# Patient Record
Sex: Female | Born: 1970 | Hispanic: Yes | Marital: Married | Smoking: Never smoker
Health system: Southern US, Community
[De-identification: ages and names within clinical notes are randomized; demographics above are authoritative.]

## PROBLEM LIST (undated history)

## (undated) DIAGNOSIS — I10 Essential (primary) hypertension: Secondary | ICD-10-CM

## (undated) HISTORY — PX: ABDOMINAL SURGERY: SHX537

## (undated) HISTORY — PX: OTHER SURGICAL HISTORY: SHX169

---

## 2003-02-03 ENCOUNTER — Other Ambulatory Visit: Admission: RE | Admit: 2003-02-03 | Discharge: 2003-02-03 | Payer: Self-pay | Admitting: Obstetrics and Gynecology

## 2003-05-28 ENCOUNTER — Other Ambulatory Visit: Admission: RE | Admit: 2003-05-28 | Discharge: 2003-05-28 | Payer: Self-pay | Admitting: Obstetrics and Gynecology

## 2003-12-23 ENCOUNTER — Other Ambulatory Visit: Admission: RE | Admit: 2003-12-23 | Discharge: 2003-12-23 | Payer: Self-pay | Admitting: Gynecology

## 2004-02-04 ENCOUNTER — Ambulatory Visit: Payer: Self-pay | Admitting: *Deleted

## 2004-02-04 ENCOUNTER — Other Ambulatory Visit: Admission: RE | Admit: 2004-02-04 | Discharge: 2004-02-04 | Payer: Self-pay | Admitting: *Deleted

## 2004-02-18 ENCOUNTER — Ambulatory Visit: Payer: Self-pay | Admitting: *Deleted

## 2004-06-07 ENCOUNTER — Other Ambulatory Visit: Admission: RE | Admit: 2004-06-07 | Discharge: 2004-06-07 | Payer: Self-pay | Admitting: Gynecology

## 2005-06-15 ENCOUNTER — Ambulatory Visit (HOSPITAL_COMMUNITY): Admission: RE | Admit: 2005-06-15 | Discharge: 2005-06-15 | Payer: Self-pay | Admitting: *Deleted

## 2005-08-08 ENCOUNTER — Ambulatory Visit (HOSPITAL_COMMUNITY): Admission: RE | Admit: 2005-08-08 | Discharge: 2005-08-08 | Payer: Self-pay | Admitting: Obstetrics & Gynecology

## 2005-08-20 ENCOUNTER — Ambulatory Visit: Payer: Self-pay | Admitting: Family Medicine

## 2005-08-27 ENCOUNTER — Ambulatory Visit (HOSPITAL_COMMUNITY): Admission: RE | Admit: 2005-08-27 | Discharge: 2005-08-27 | Payer: Self-pay | Admitting: Obstetrics & Gynecology

## 2005-08-27 ENCOUNTER — Ambulatory Visit: Payer: Self-pay | Admitting: Gynecology

## 2005-08-29 ENCOUNTER — Encounter: Admission: RE | Admit: 2005-08-29 | Discharge: 2005-08-29 | Payer: Self-pay | Admitting: Obstetrics & Gynecology

## 2005-09-06 ENCOUNTER — Ambulatory Visit: Payer: Self-pay | Admitting: *Deleted

## 2005-09-13 ENCOUNTER — Ambulatory Visit: Payer: Self-pay | Admitting: Gynecology

## 2005-09-15 ENCOUNTER — Ambulatory Visit: Payer: Self-pay | Admitting: Family Medicine

## 2005-09-15 ENCOUNTER — Inpatient Hospital Stay (HOSPITAL_COMMUNITY): Admission: AD | Admit: 2005-09-15 | Discharge: 2005-09-18 | Payer: Self-pay | Admitting: Family Medicine

## 2005-11-16 ENCOUNTER — Inpatient Hospital Stay (HOSPITAL_COMMUNITY): Admission: AD | Admit: 2005-11-16 | Discharge: 2005-11-16 | Payer: Self-pay | Admitting: Family Medicine

## 2006-04-14 IMAGING — US US OB DETAIL+14 WK
1 series · 13 of 28 positions shown · non-contrast
Comparison: none

CLINICAL DATA: Anatomy.  Advanced maternal age.

[Series 1: us ob detail+14 wk · 0.33mm/px · 119 acquisitions, 13 frames shown]
[im 5/119]
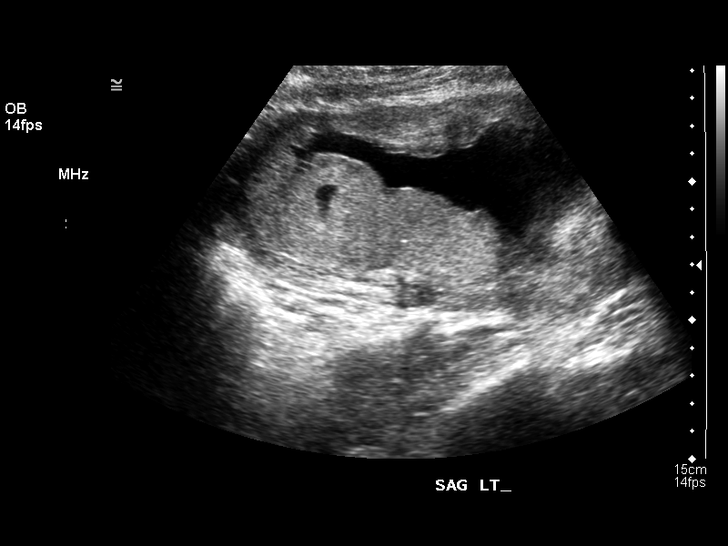
[im 14/119]
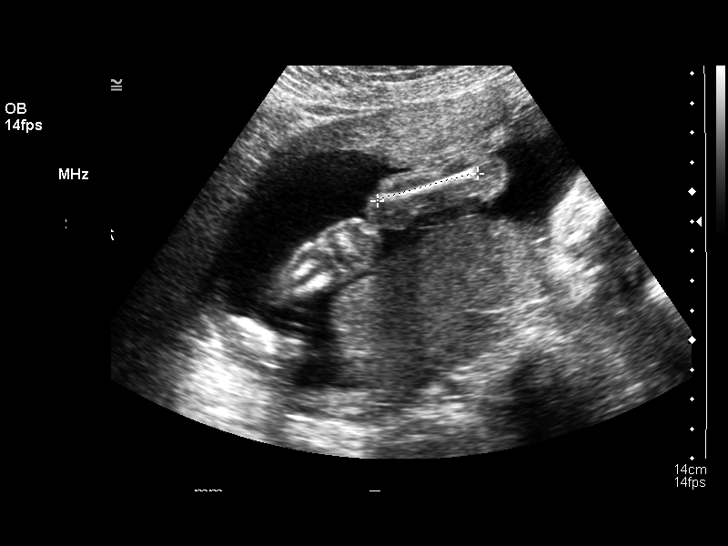
[im 22/119]
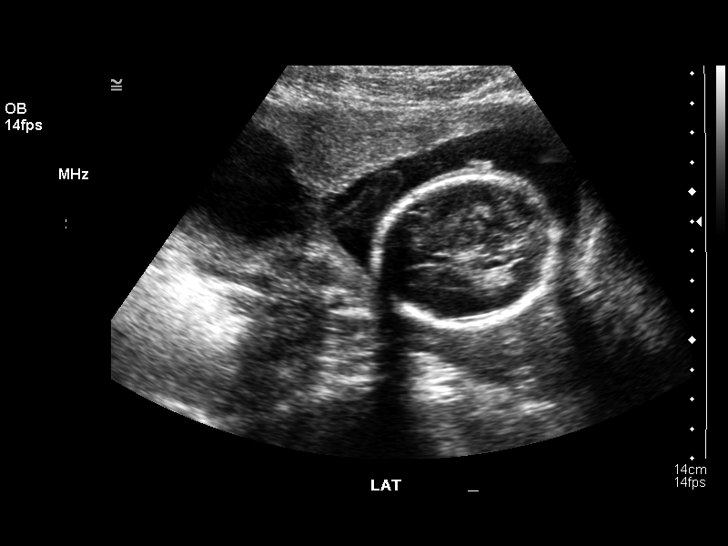
[im 31/119]
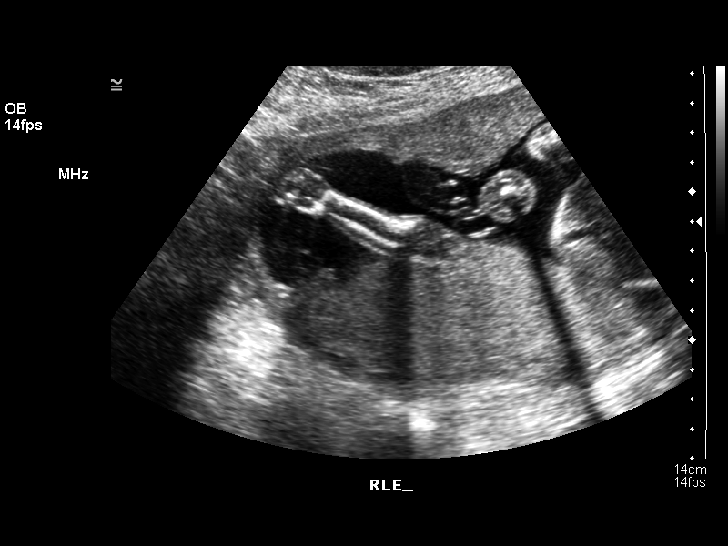
[im 40/119]
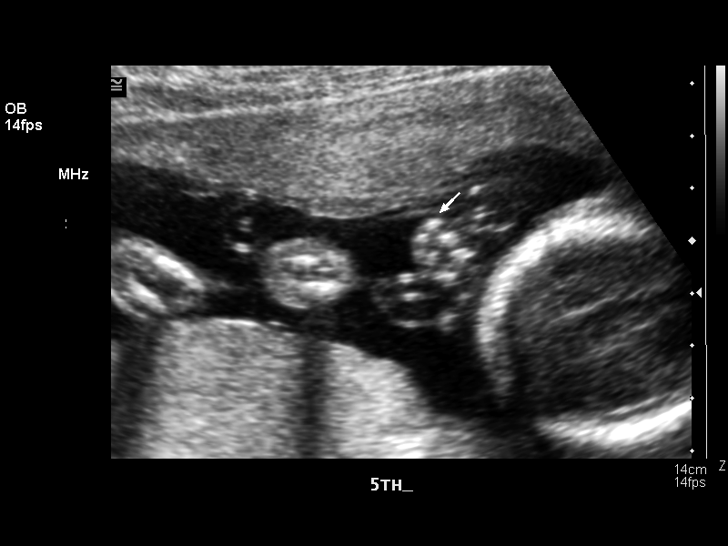
[im 49/119]
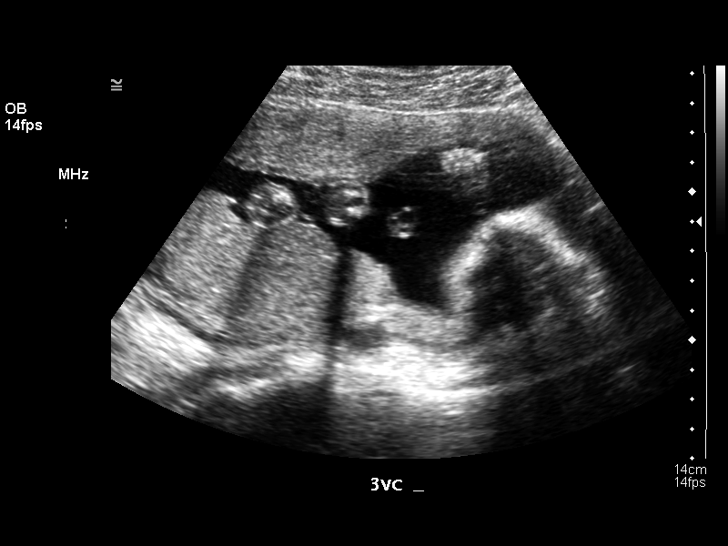
[im 62/119]
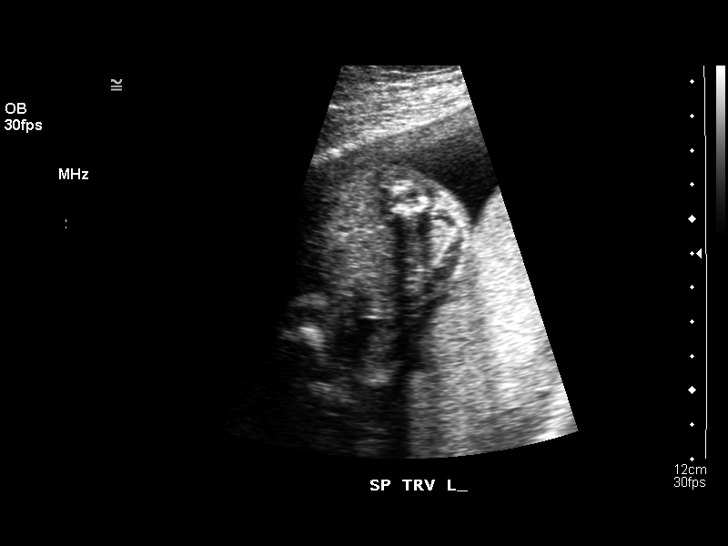
[im 70/119]
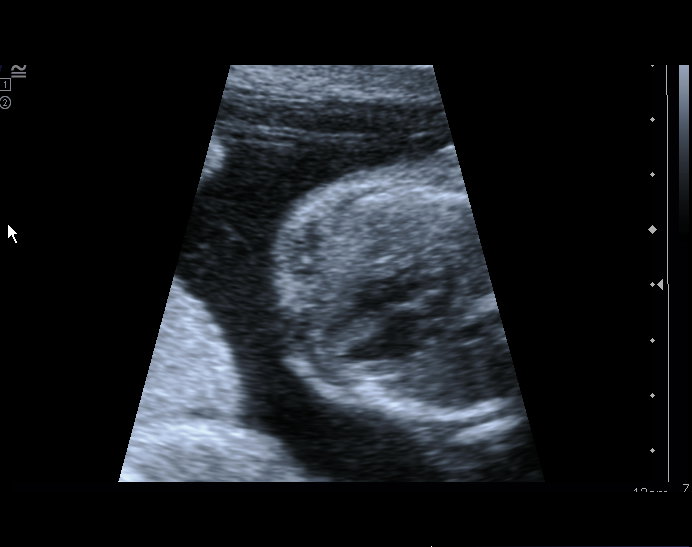
[im 79/119]
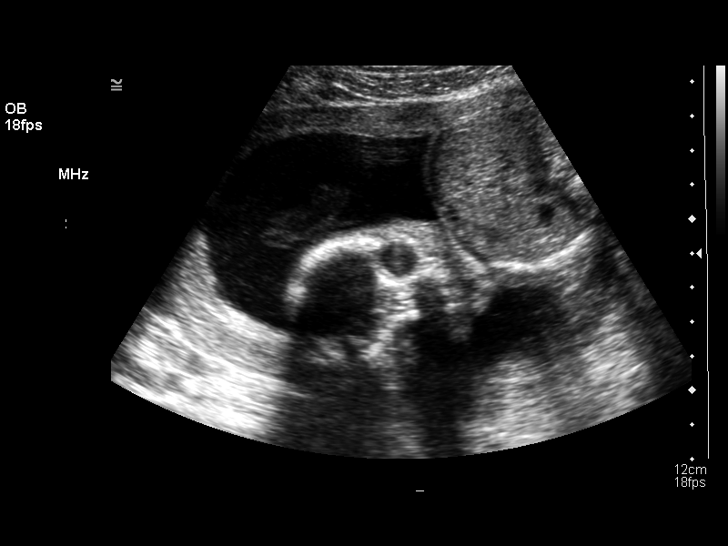
[im 88/119]
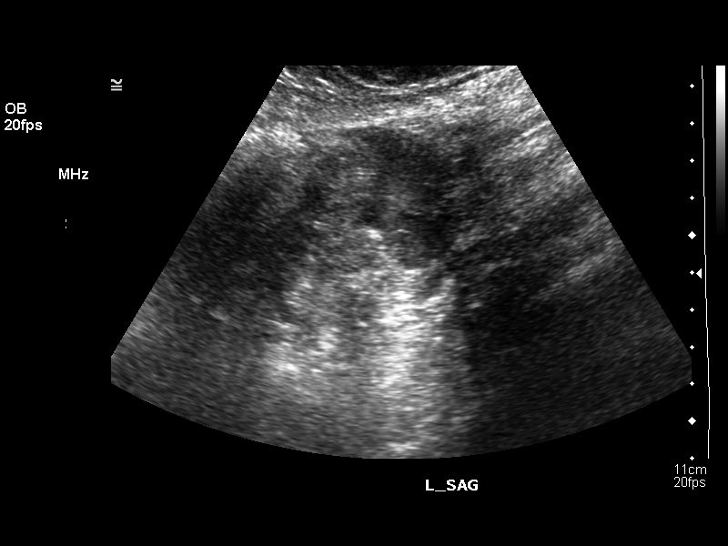
[im 97/119]
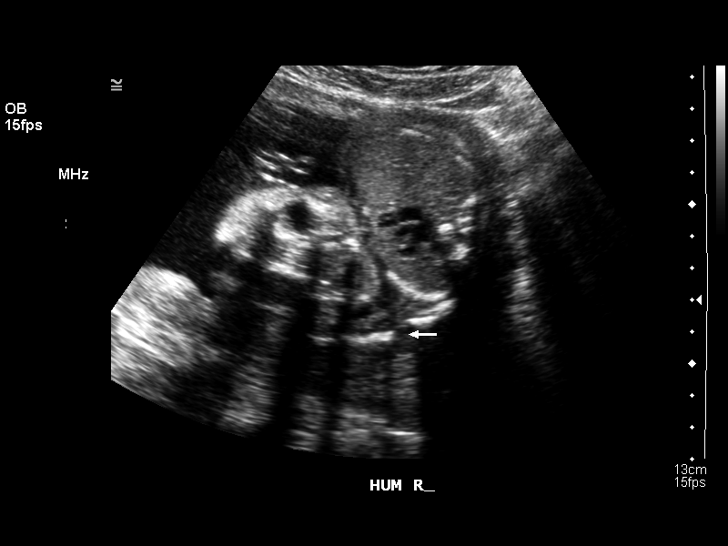
[im 105/119]
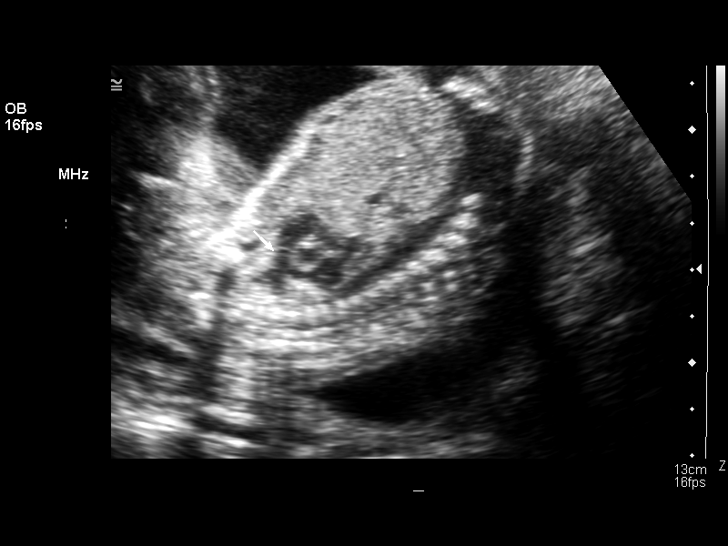
[im 114/119]
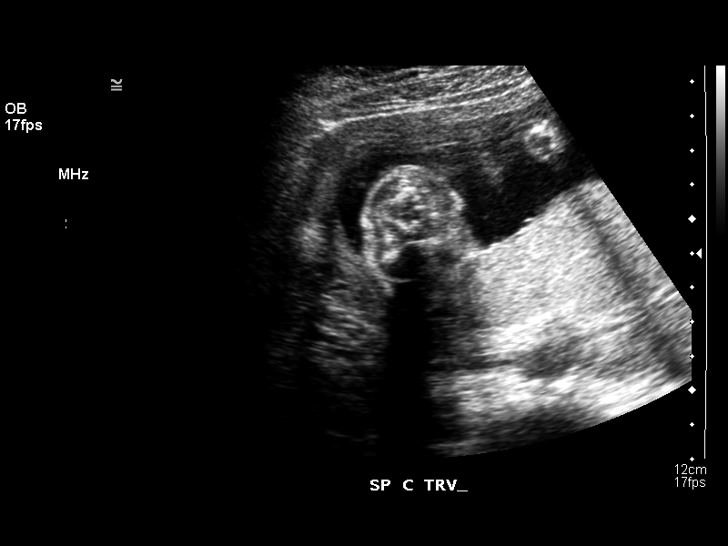

[13 of 28 positions shown; findings below may reference images not displayed]

DETAILED OBSTETRICAL ULTRASOUND:

 Number of Fetuses: 1
 Heart Rate:  150
 Movement:  Yes
 Breathing:  Yes
 Presentation:  Cephalic
 Placental Location:  Posterior
 Grade: I
 Previa:  No
 Amniotic Fluid (Subjective):  Normal
 Amniotic Fluid (Objective):  4.0 cm Vertical pocket 

 FETAL BIOMETRY
 BPD:  4.9 cm  20 w 6 d
 HC:  18.9 cm  21 w 1 d
 AC:  16.5 cm  21 w 4 d
 FL:   3.5 cm  21 w 1 d
 HL:   3.5 cm  22 w 0 d

 MEAN GA:  21 w 2 d

 FETAL ANATOMY
 Lateral Ventricles:  Visualized 
 Thalami/CSP:  Visualized 
 Posterior Fossa:  Visualized 
 Nuchal Region:  Visualized 
 Spine:  Visualized 
 4 Chamber Heart on Left:  Visualized 
 Stomach on Left:  Visualized 
 3 Vessel Cord:  Visualized 
 Cord Insertion Site:  Visualized 
 Kidneys:  Visualized 
 Bladder:  Visualized 
 Extremities:  Visualized 

 ADDITIONAL ANATOMY VISUALIZED:  LVOT, RVOT, upper lip, orbits, profile, diaphragm, heel, 5th digit, ductal arch, aortic arch, female genitalia, and nasal bone.

 MATERNAL UTERINE AND ADNEXAL FINDINGS
 Cervix:  3.8 cm Transabdominally.  Ovaries not seen.
IMPRESSION: 1.  Single living intrauterine fetus in cephalic presentation with subjectively normal amniotic fluid volume.  The estimated mean gestational age by ultrasound today is 21 weeks 2 days with good concordance of the fetal biometric parameters.  
 2.  Visualized fetal anatomy is unremarkable with good fetal anatomic assessment possible.

## 2006-06-07 IMAGING — US US OB FOLLOW-UP
1 series · 13 of 28 positions shown · non-contrast
Comparison: OB ultrasound 06/15/05.

CLINICAL DATA: Size less than dates, advanced maternal age. 

OBSTETRICAL ULTRASOUND RE-EVALUATION:

[Series 1: us ob follow-up · 0.31mm/px · 13 of 39 slices shown]
[im 2/39]
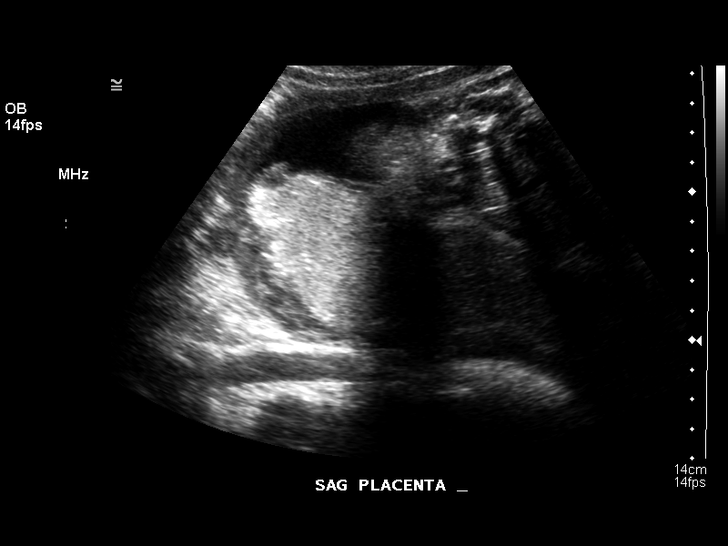
[im 5/39]
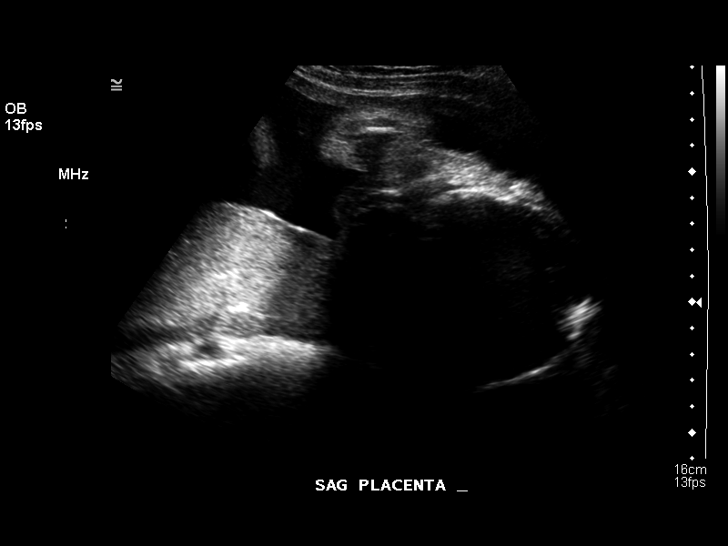
[im 8/39]
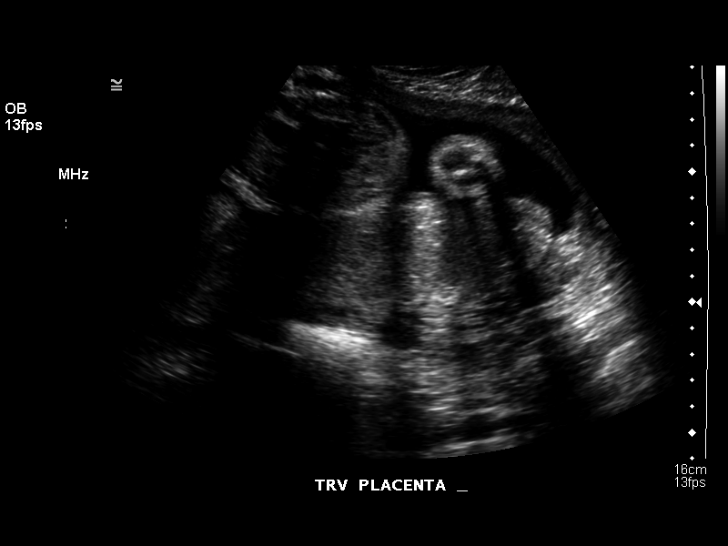
[im 10/39]
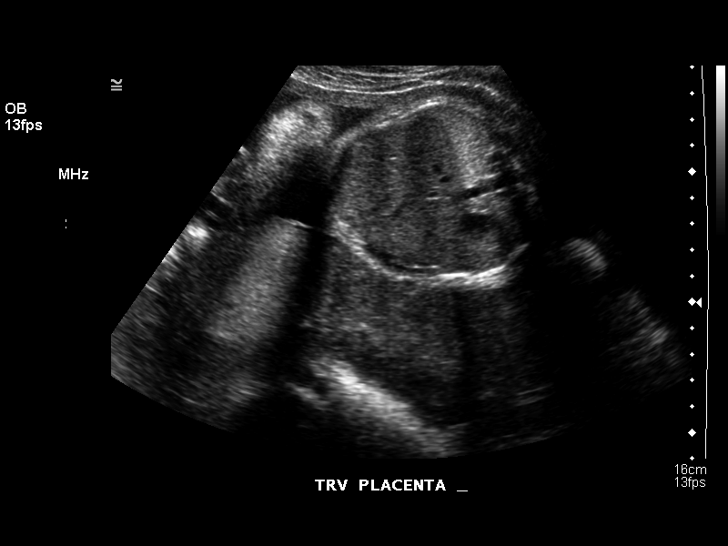
[im 13/39]
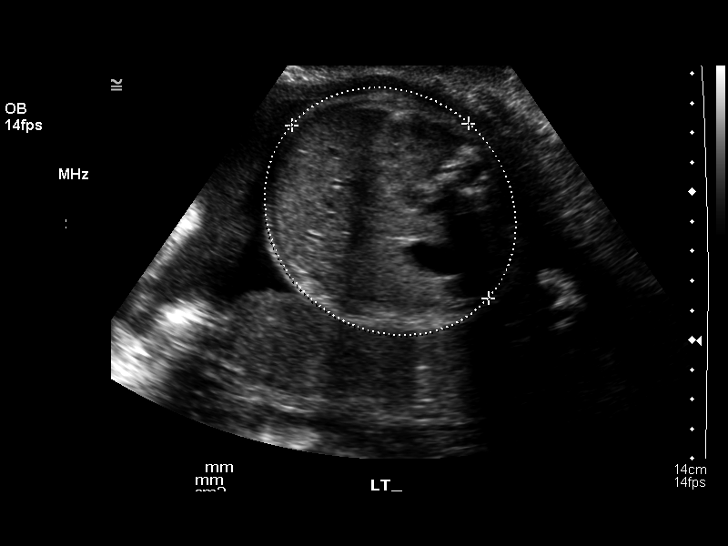
[im 16/39]
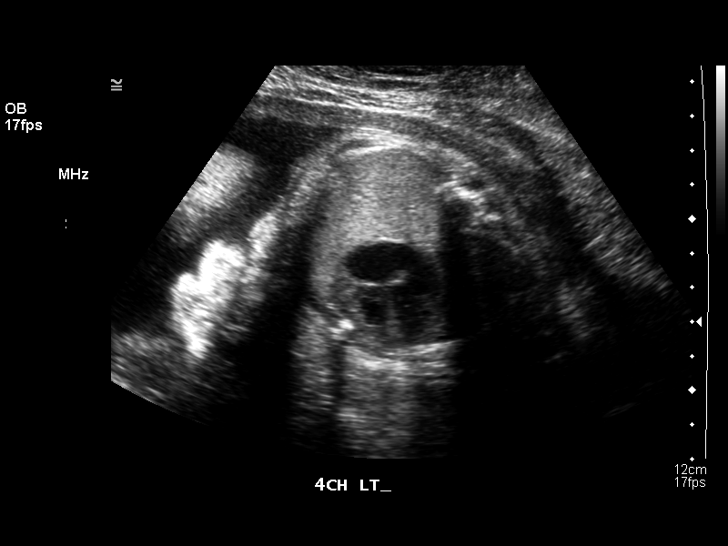
[im 20/39]
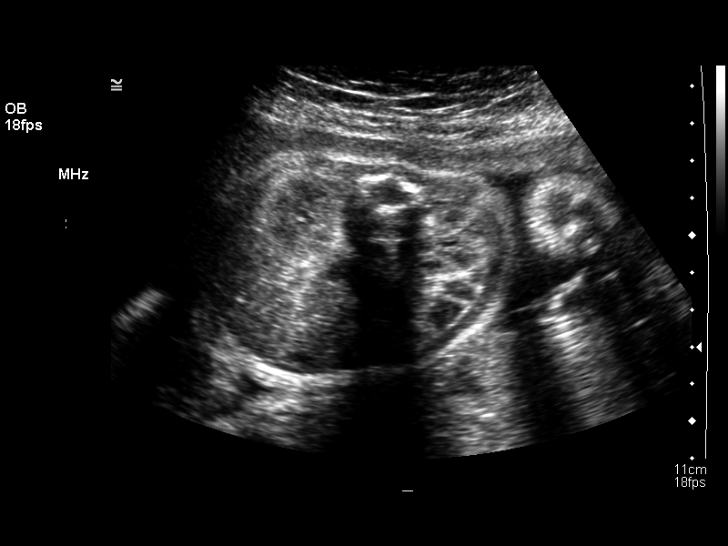
[im 23/39]
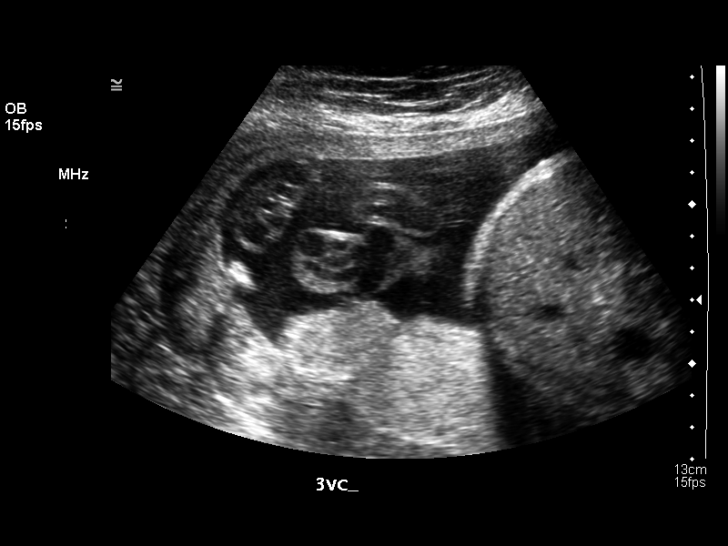
[im 26/39]
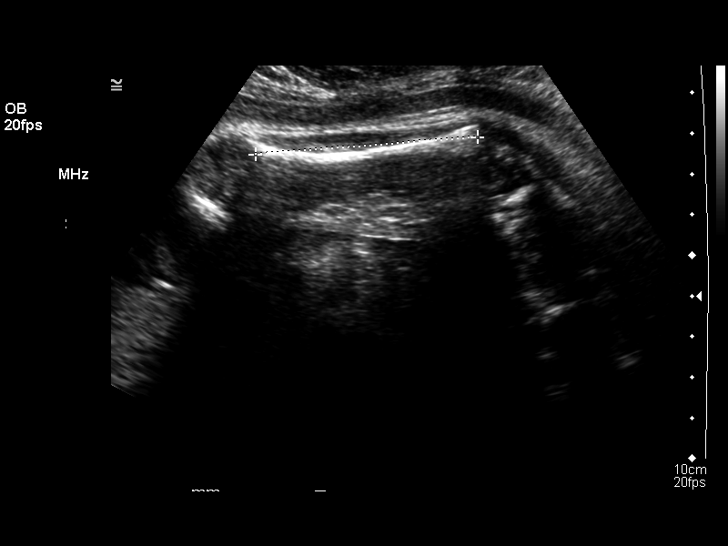
[im 29/39]
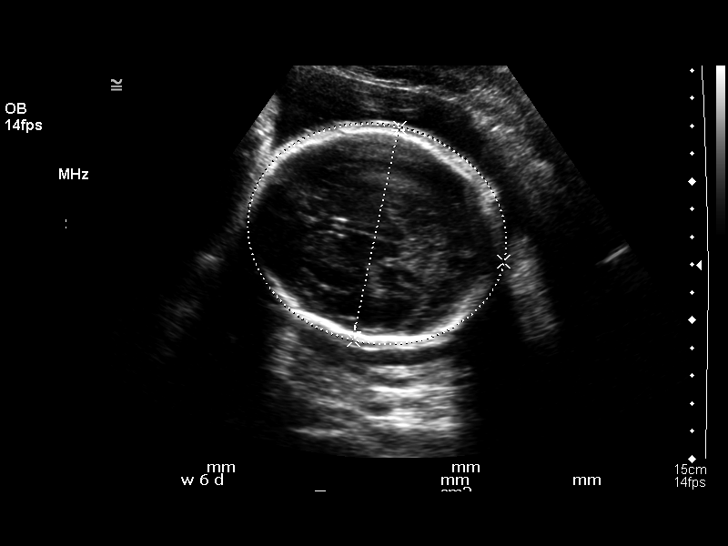
[im 31/39]
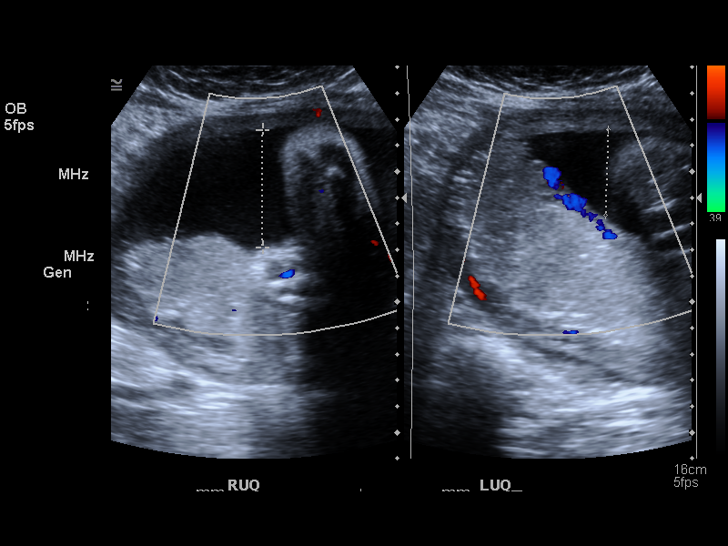
[im 34/39]
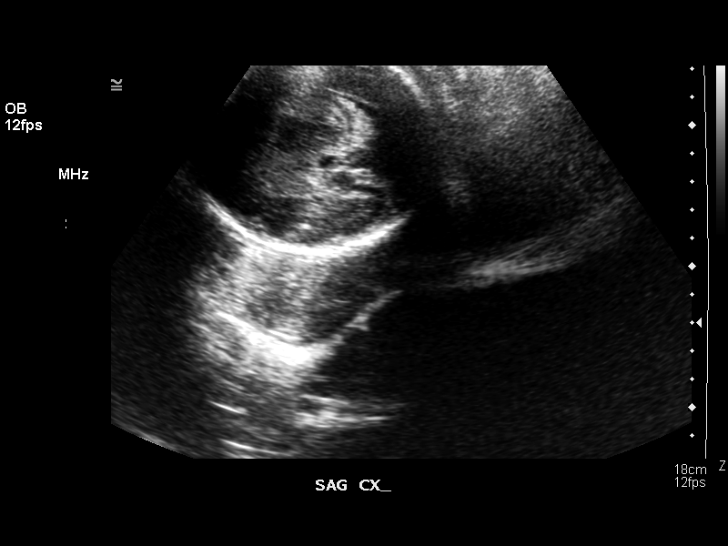
[im 37/39]
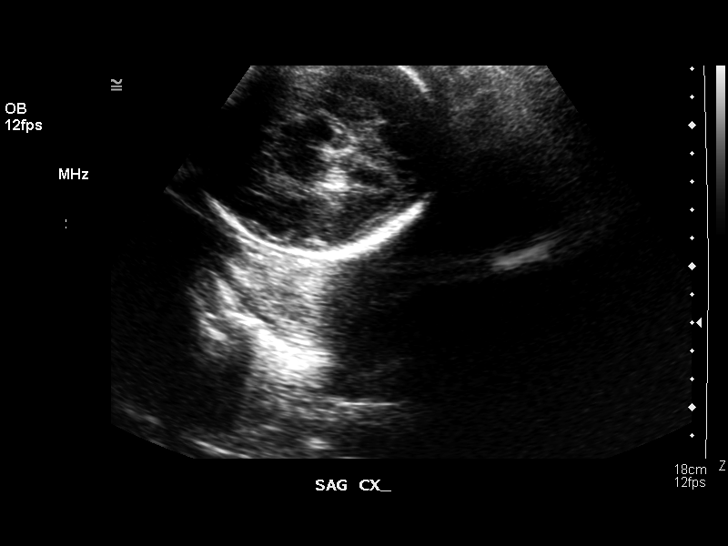

[13 of 28 positions shown; findings below may reference images not displayed]

Number of Fetuses: 1
Heart Rate:  
Movement:  Yes
Breathing:  Yes
Presentation:  Cephalic
Placental Location: Posterior
Grade:  II
Previa:  No
Amniotic Fluid (subjective):  Normal
Amniotic Fluid (objective):  16.0 cm AFI (5th -95th%ile = 9.0 ? 23.4 cm for 30 wks)

FETAL BIOMETRY
BPD:  7.3 cm   29 w 4 d
HC:  26.9 cm  29 w 3 d
AC:  25.6 cm  29 w6d         FL:  5.3 cm   28 w 4 d

Mean GA:  29 w 3 d  US EDC:  10/21/05
Assigned GA:  29 w 6 d  Assigned EDC:  10/18/05

EFW:  2767 g (H) 50th ? 75th%ile (7072 ? 6766 g) For 30 wks

FETAL ANATOMY
Lateral Ventricles:  Visualized 
Thalami/CSP:  Visualized 
Posterior Fossa:  Previously seen 
Nuchal Region:  Previously seen 
Spine:  Previously seen 
4 Chamber Heart on Left:  Visualized 
Stomach on Left:  Visualized 
3 Vessel Cord:  Visualized 
Cord Insertion Site:  Previously seen 
Kidneys:  Visualized 
Bladder:  Visualized 
Extremities:  Previously seen 
MATERNAL UTERINE AND ADNEXAL FINDINGS
Cervix:  3.2 cm Transabdominally
IMPRESSION: Single live intrauterine gestation in cephalic presentation with average ultrasound age of 29 weeks 3 days demonstrating appropriate interval growth as compared to the LMP and expected gestational age by prior ultrasound as above.

## 2006-06-26 IMAGING — US US OB FOLLOW-UP
1 series · 13 of 28 positions shown · non-contrast
Comparison: none

CLINICAL DATA: 35-year-old.  Evaluate growth.  Patient is AMA and gestational diabetic  G1 P0 with LMP of 01/11/06. Assigned EDC is 10/18/05 making the [REDACTED] weeks and 4 days.

[Series 1: us ob follow-up · 0.39mm/px · 13 of 40 slices shown]
[im 2/40]
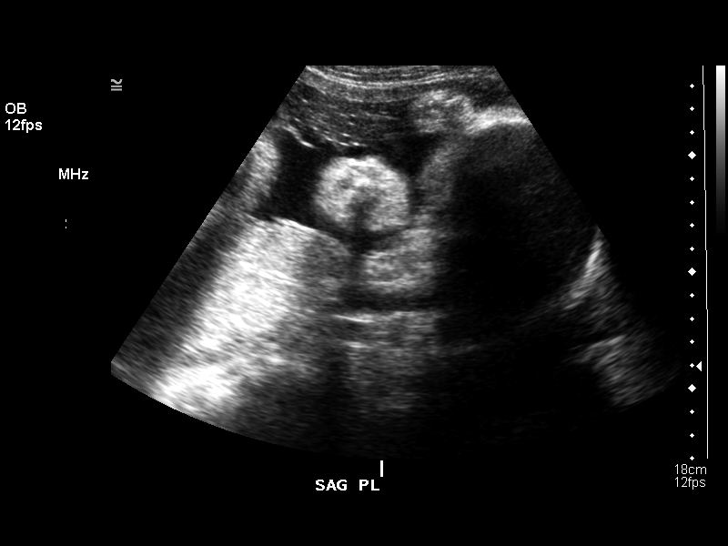
[im 5/40]
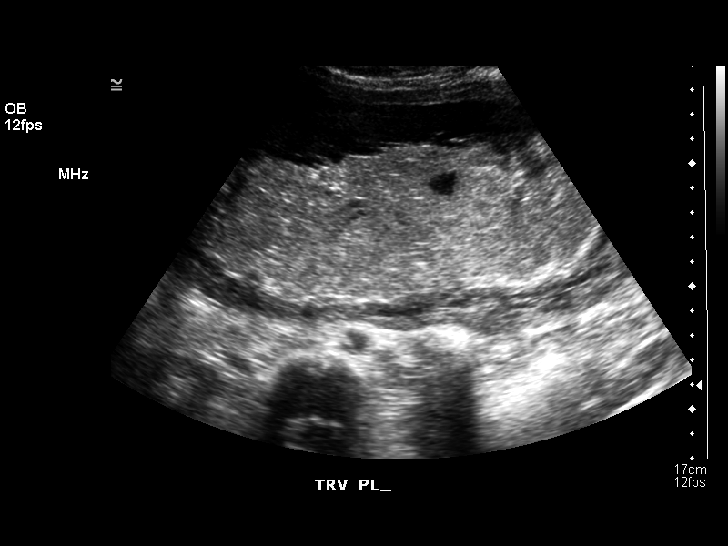
[im 8/40]
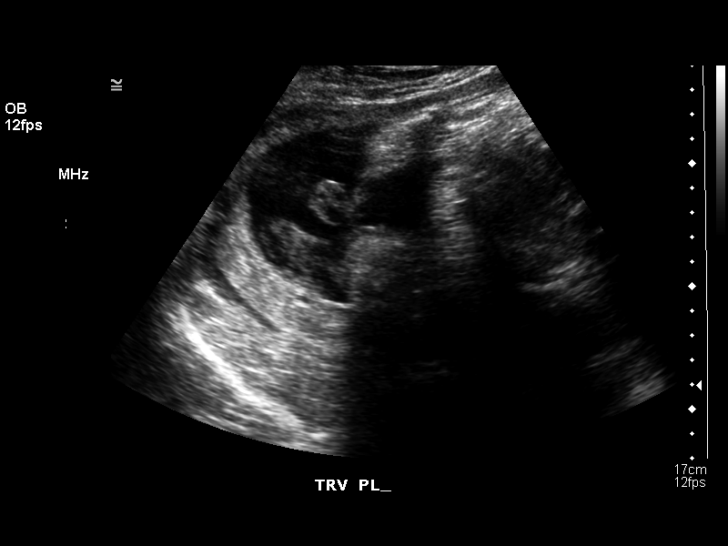
[im 11/40]
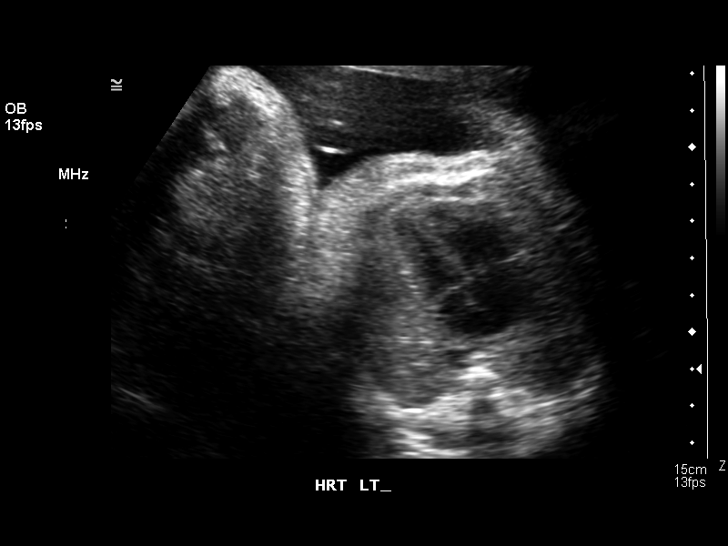
[im 14/40]
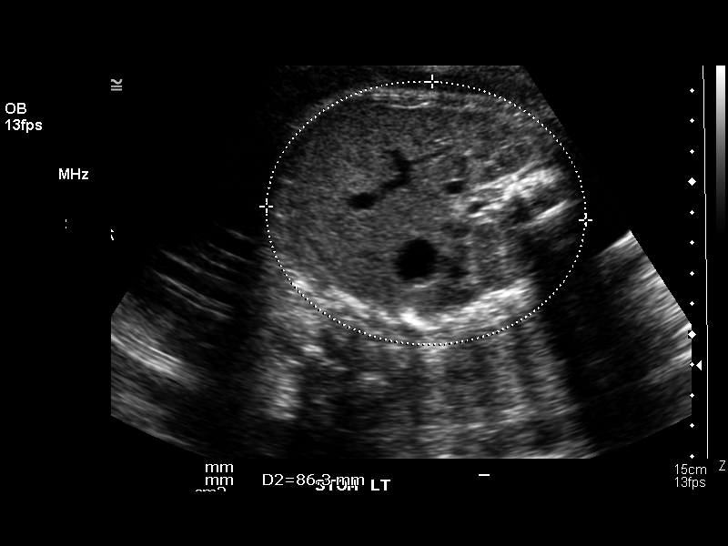
[im 16/40]
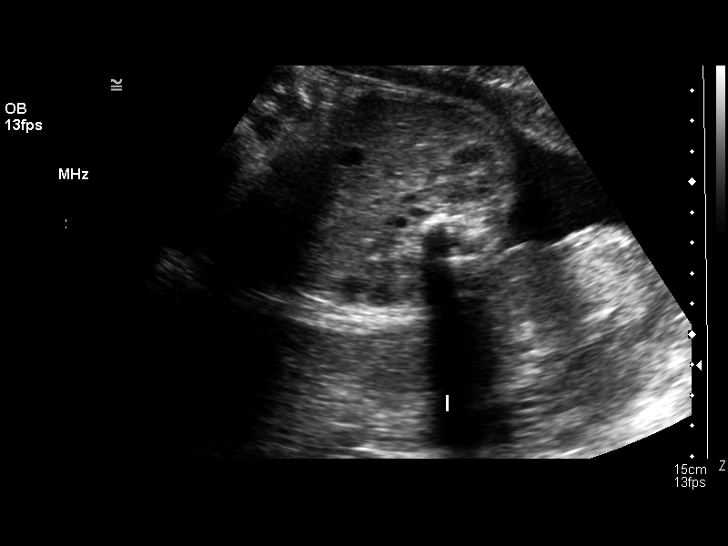
[im 21/40]
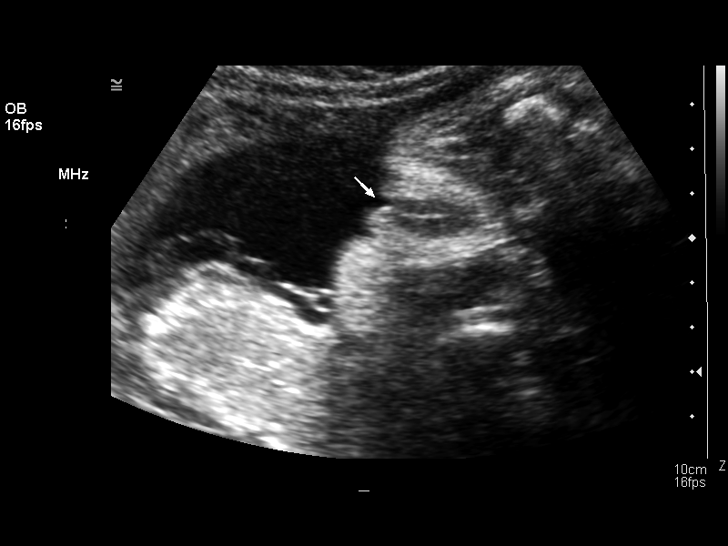
[im 24/40]
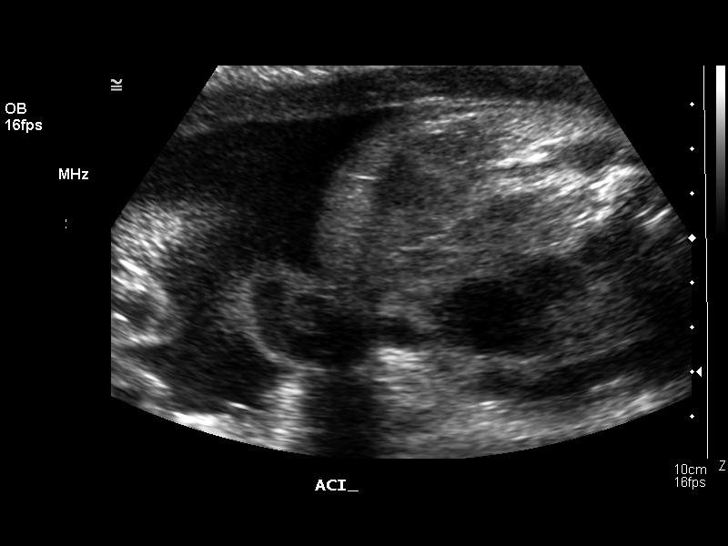
[im 27/40]
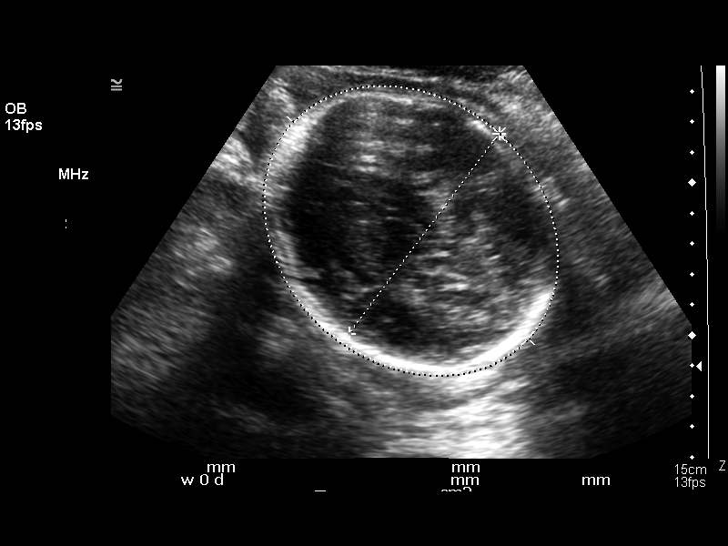
[im 29/40]
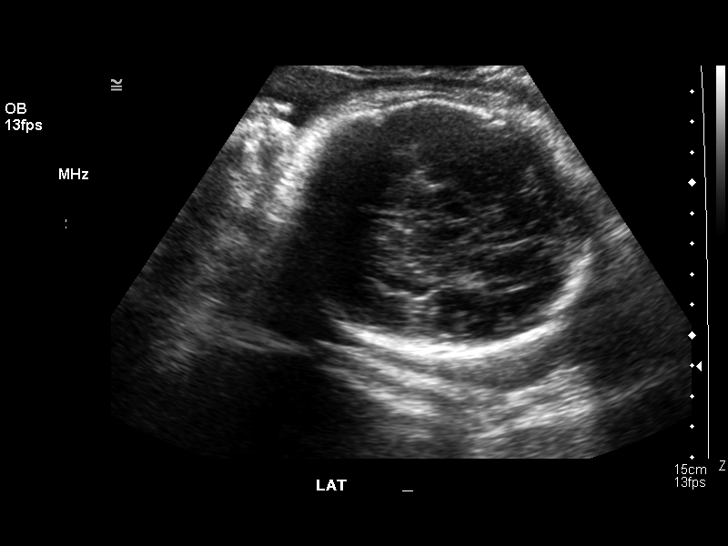
[im 32/40]
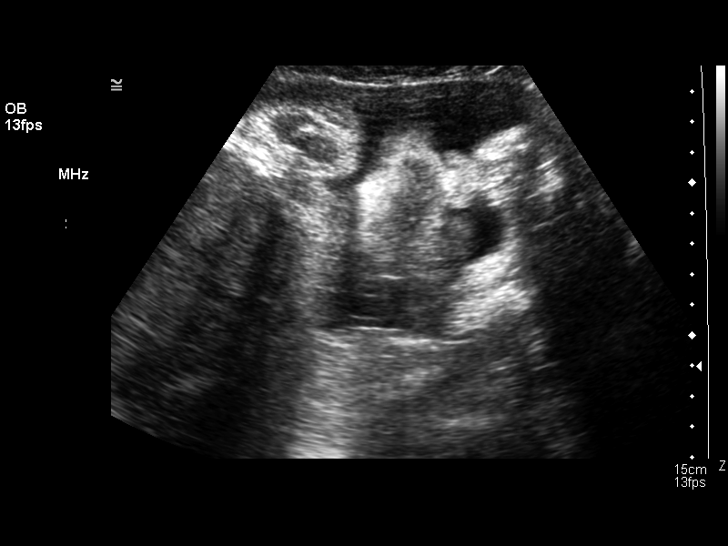
[im 35/40]
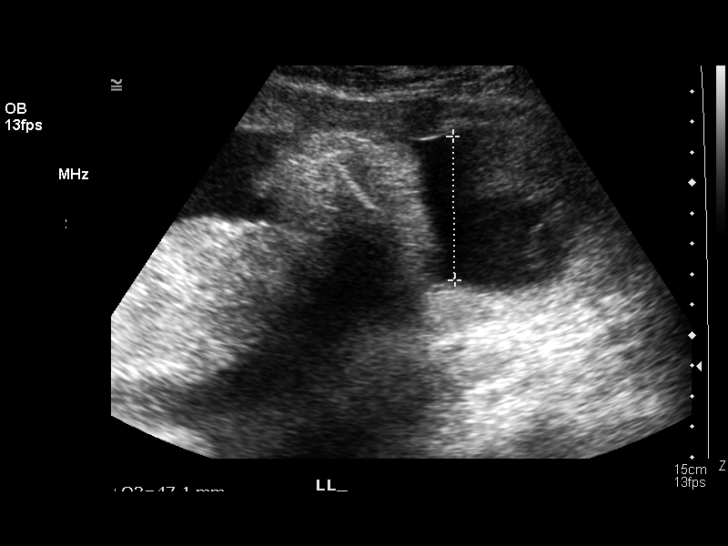
[im 38/40]
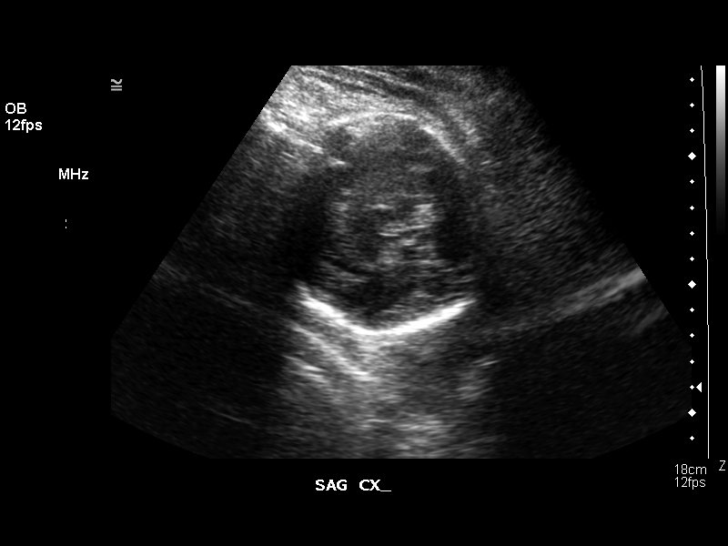

[13 of 28 positions shown; findings below may reference images not displayed]

OBSTETRICAL ULTRASOUND RE-EVALUATION:
 Number of Fetuses: 1
 Heart Rate:  139
 Movement:  Yes
 Breathing:  Yes
 Presentation: Cephalic
 Placental Location:  Posterior
 Grade:  III
 Previa:  No
 Amniotic Fluid (subjective):  Normal
 Amniotic Fluid (objective): 16.0 cm AFI (5th -95th%ile = 8.3 – 24.5 cm for 33 wks)

 FETAL BIOMETRY
 BPD:  8.1 cm  32 w 4 d
 HC:  30.1 cm  33 w 2 d
 AC:   29.9 cm   33 w 6 d
 FL:   6.5 cm  33 w 3 d

 Mean GA:  33 w 2 d  US EDC:  10/13/05
 Assigned GA:  32 w 4 d  Assigned EDC:  10/18/05

 EFW:  0001 g (H) 75th – 90th%ie (2712 – 5650 g) For 33 wks

 FETAL ANATOMY
 Lateral Ventricles:  Visualized 
 Thalami/CSP:  Visualized 
 Posterior Fossa:  Previously seen 
 Nuchal Region:  Previously seen 
 Spine:  Previously seen 
 4 Chamber Heart on Left:  Visualized 
 Stomach on Left:  Visualized 
 3 Vessel Cord:  Visualized 
 Cord Insertion Site:  Visualized 
 Kidneys:  Visualized 
 Bladder:  Visualized 
 Extremities:  Previously seen 

 MATERNAL UTERINE AND ADNEXAL FINDINGS
 Cervix:  3.2 cm Transabdominally
IMPRESSION: Single living intrauterine fetus in cephalic presentation. Amniotic fluid volume is within normal limits.  Size by ultrasound is 5 days ahead of the age predicted by LMP.

## 2012-01-04 ENCOUNTER — Emergency Department (HOSPITAL_COMMUNITY)
Admission: EM | Admit: 2012-01-04 | Discharge: 2012-01-04 | Disposition: A | Discharge status: Home / Self Care | Payer: Worker's Compensation | Attending: Emergency Medicine | Admitting: Emergency Medicine

## 2012-01-04 ENCOUNTER — Encounter (HOSPITAL_COMMUNITY): Payer: Self-pay | Admitting: *Deleted

## 2012-01-04 DIAGNOSIS — X19XXXA Contact with other heat and hot substances, initial encounter: Secondary | ICD-10-CM | POA: Insufficient documentation

## 2012-01-04 DIAGNOSIS — I10 Essential (primary) hypertension: Secondary | ICD-10-CM | POA: Insufficient documentation

## 2012-01-04 DIAGNOSIS — T2026XA Burn of second degree of forehead and cheek, initial encounter: Secondary | ICD-10-CM | POA: Insufficient documentation

## 2012-01-04 DIAGNOSIS — T300 Burn of unspecified body region, unspecified degree: Secondary | ICD-10-CM

## 2012-01-04 HISTORY — DX: Essential (primary) hypertension: I10
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
## 2012-01-04 NOTE — ED Notes (Signed)
PA at bedside.

## 2012-01-05 NOTE — ED Provider Notes (Signed)
Medical screening examination/treatment/procedure(s) were performed by non-physician practitioner and as supervising physician I was immediately available for consultation/collaboration.    Kalob Bergen L Yehuda Printup, MD 01/05/12 1701 

## 2012-11-25 ENCOUNTER — Encounter: Payer: Self-pay | Admitting: *Deleted

## 2012-11-25 ENCOUNTER — Emergency Department
Admission: EM | Admit: 2012-11-25 | Discharge: 2012-11-25 | Disposition: A | Discharge status: Home / Self Care | Payer: Self-pay | Source: Home / Self Care | Attending: Family Medicine | Admitting: Family Medicine

## 2012-11-25 DIAGNOSIS — J069 Acute upper respiratory infection, unspecified: Secondary | ICD-10-CM

## 2012-11-25 DIAGNOSIS — J029 Acute pharyngitis, unspecified: Secondary | ICD-10-CM

## 2012-11-25 DIAGNOSIS — I10 Essential (primary) hypertension: Secondary | ICD-10-CM

## 2012-11-25 MED ORDER — AMOXICILLIN 500 MG PO CAPS: 500.0000 mg | ORAL_CAPSULE | Freq: Three times a day (TID) | ORAL | Status: AC

## 2012-11-25 NOTE — ED Notes (Signed)
Pt c/o sore throat, SOB, some cough, fever, and HA x 1 day.

## 2012-11-25 NOTE — ED Provider Notes (Signed)
CSN: [id number]     Arrival date & time 11/25/12  1819 History   First MD Initiated Contact with Patient 11/25/12 1837     Chief Complaint  Patient presents with  . Fever  . Generalized Body Aches  . Sore Throat  . Shortness of Breath      HPI Comments: Patient's husband acts as Nurse, learning disability (Spanish). Patient has had sore throat, cough, headache and fever for about 2 days. She was recently started on methyldopa 250mg , one-half tab bid for hypertension by her PCP in [facility].  She had been on lisinopril but was changed to methyldopa because she is attempting to become pregnant.  The history is provided by the patient and the spouse. The history is limited by a language barrier. No language interpreter was used.    History reviewed. No pertinent past medical history. Past Surgical History  Procedure Laterality Date  . Abdominal surgery     History reviewed. No pertinent family history. History  Substance Use Topics  . Smoking status: Never Smoker   . Smokeless tobacco: Not on file  . Alcohol Use: No   OB History   Grav Para Term Preterm Abortions TAB SAB Ect Mult Living                 Review of Systems + sore throat + cough No pleuritic pain No wheezing + nasal congestion ? post-nasal drainage No sinus pain/pressure No itchy/red eyes ? earache No hemoptysis ? SOB + fever  No nausea No vomiting No abdominal pain No diarrhea No urinary symptoms No skin rashes + fatigue ? myalgias + headache    Allergies  Review of patient's allergies indicates no known allergies.  Home Medications   Current Outpatient Rx  Name  Route  Sig  Dispense  Refill  . methyldopa (ALDOMET) 250 MG tablet   Oral   Take 250 mg by mouth 3 (three) times daily.         Marland Kitchen amoxicillin (AMOXIL) 500 MG capsule   Oral   Take 1 capsule (500 mg total) by mouth 3 (three) times daily.   30 capsule   0    BP 158/119  Pulse 102  Temp(Src) 98.2 F (36.8 C) (Oral)  Resp 18  Wt 122  lb (55.339 kg)  SpO2 94% Physical Exam Nursing notes and Vital Signs reviewed. Appearance:  Patient appears healthy, stated age, and in no acute distress Eyes:  Pupils are equal, round, and reactive to light and accomodation.  Extraocular movement is intact.  Conjunctivae are not inflamed  Ears:  Canals normal.  Tympanic membranes normal.  Nose:  Mildly congested turbinates.  No sinus tenderness.    Pharynx:  Normal Neck:  Supple.  Slightly tender shotty posterior nodes are palpated bilaterally  Lungs:  Clear to auscultation.  Breath sounds are equal.  Heart:  Regular rate and rhythm without murmurs, rubs, or gallops.  Abdomen:  Nontender without masses or hepatosplenomegaly.  Bowel sounds are present.  No CVA or flank tenderness.  Extremities:  No edema.  No calf tenderness Skin:  No rash present.   ED Course  Procedures  none        MDM   1. Acute pharyngitis   2. Acute upper respiratory infections of unspecified site   3. Essential hypertension, benign; uncontrolled    Begin amoxicillin Rest, increase fluid intake. For fever and pain may take Tylenol.  For cough may take Robitussin DM.   Increase Methyldopa 250mg  to one  tab twice daily.  Followup with PCP in 2 weeks for BP.    Lattie Haw, MD 11/29/12 [phone]

## 2012-11-30 ENCOUNTER — Telehealth: Payer: Self-pay

## 2012-11-30 NOTE — ED Notes (Signed)
Patient does not speak english. I spoke with Alejandros and he states she is still not well. I advised him to bring her back for a recheck.

## 2016-06-22 ENCOUNTER — Encounter: Payer: Self-pay | Admitting: *Deleted

## 2017-03-30 ENCOUNTER — Ambulatory Visit: Payer: Self-pay | Admitting: Urgent Care

## 2017-06-05 ENCOUNTER — Other Ambulatory Visit: Payer: Self-pay

## 2017-06-05 ENCOUNTER — Ambulatory Visit: Payer: Self-pay | Admitting: Emergency Medicine

## 2017-06-05 ENCOUNTER — Encounter: Payer: Self-pay | Admitting: Emergency Medicine

## 2017-06-05 VITALS — BP 142/89 | HR 89 | Temp 98.3°F | Resp 16 | Ht 58.25 in | Wt 125.0 lb

## 2017-06-05 DIAGNOSIS — K224 Dyskinesia of esophagus: Secondary | ICD-10-CM

## 2017-06-05 DIAGNOSIS — M549 Dorsalgia, unspecified: Secondary | ICD-10-CM

## 2017-06-05 DIAGNOSIS — M7918 Myalgia, other site: Secondary | ICD-10-CM

## 2017-06-05 DIAGNOSIS — K219 Gastro-esophageal reflux disease without esophagitis: Secondary | ICD-10-CM

## 2017-06-05 DIAGNOSIS — R070 Pain in throat: Secondary | ICD-10-CM

## 2017-06-05 MED ORDER — OMEPRAZOLE 20 MG PO CPDR: 20.0000 mg | DELAYED_RELEASE_CAPSULE | Freq: Every day | ORAL | 3 refills | Status: AC

## 2017-06-05 MED ORDER — DICLOFENAC SODIUM 75 MG PO TBEC: 75.0000 mg | DELAYED_RELEASE_TABLET | Freq: Two times a day (BID) | ORAL | 0 refills | Status: AC

## 2017-06-05 NOTE — Patient Instructions (Addendum)
   IF you received an x-ray today, you will receive an invoice from [facility]. Please contact [facility] at [phone] with questions or concerns regarding your invoice.   IF you received labwork today, you will receive an invoice from [facility]. Please contact [facility] at [phone] with questions or concerns regarding your invoice.   Our billing staff will not be able to assist you with questions regarding bills from these companies.  You will be contacted with the lab results as soon as they are available. The fastest way to get your results is to activate your My Chart account. Instructions are located on the last page of this paperwork. If you have not heard from us regarding the results in 2 weeks, please contact this office.     Dolor de espalda en adultos (Back Pain, Adult) El dolor de espalda es muy frecuente. A menudo mejora con el tiempo. La causa del dolor de espalda generalmente no es peligrosa. La mayora de las personas puede aprender a manejar el dolor de espalda por s mismas. CUIDADOS EN EL HOGAR Controle su dolor de espalda a fin de detectar algn cambio. Las siguientes indicaciones ayudarn a aliviar cualquier dolor que pueda sentir:  Mantngase activo. Comience con caminatas cortas sobre superficies planas si es posible. Trate de caminar un poco ms cada da.  Haga ejercicios con regularidad tal como le indic el mdico. El ejercicio ayuda a que su espalda se cure ms rpidamente. Tambin ayuda a prevenir futuras lesiones al mantener los msculos fuertes y flexibles.  No se siente, conduzca ni permanezca de pie durante ms de 30 minutos.  No permanezca en la cama. Si hace reposo ms de 1 a 2 das, puede demorar su recuperacin.  Sea cuidadoso al inclinarse o levantar un objeto. Use una tcnica apropiada para levantar peso: ? Flexione las rodillas. ? Mantenga el objeto cerca del cuerpo. ? No gire.  Duerma sobre un colchn firme. Recustese  sobre un costado y flexione las rodillas. Si se recuesta sobre la espalda, coloque una almohada debajo de las rodillas.  Tome los medicamentos solamente como se lo haya indicado el mdico.  Aplique hielo sobre la zona lesionada. ? Ponga el hielo en una bolsa plstica. ? Coloque una toalla entre la piel y la bolsa de hielo. ? Deje el hielo durante 20minutos, 2 a 3veces por da, durante los primeros 2 o 3das. Despus de eso, puede alternar entre compresas de hielo y calor.  Evite sentir ansiedad o estrs. Encuentre maneras efectivas de lidiar con el estrs, como hacer ejercicio.  Mantenga un peso saludable. El peso excesivo ejerce tensin sobre la espalda. SOLICITE AYUDA SI:  Siente dolor que no se alivia con reposo o medicamentos.  Siente cada vez ms dolor que se extiende a las piernas o los glteos.  El dolor no mejora en una semana.  Siente dolor por la noche.  Pierde peso.  Siente escalofros o fiebre. SOLICITE AYUDA DE INMEDIATO SI:  No puede controlar su materia fecal (heces) o el pis (orina).  Siente debilidad en las piernas o los brazos.  Siente prdida de la sensibilidad (adormecimiento) en las piernas o los brazos.  Tiene malestar estomacal (nuseas) o vomita.  Siente dolor de estmago (abdominal).  Siente que se desvanece (se desmaya). Esta informacin no tiene como fin reemplazar el consejo del mdico. Asegrese de hacerle al mdico cualquier pregunta que tenga. Document Released: 09/18/2010 Document Revised: 03/26/2014 Document Reviewed: 07/07/2013 Elsevier Interactive Patient Education  2018 [employer]    Back Pain, Adult Back pain is very common. The pain often gets better over time. The cause of back pain is usually not dangerous. Most people can learn to manage their back pain on their own. Follow these instructions at home: Watch your back pain for any changes. The following actions may help to lessen any pain you are feeling:  Stay active. Start  with short walks on flat ground if you can. Try to walk farther each day.  Exercise regularly as told by your doctor. Exercise helps your back heal faster. It also helps avoid future injury by keeping your muscles strong and flexible.  Do not sit, drive, or stand in one place for more than 30 minutes.  Do not stay in bed. Resting more than 1-2 days can slow down your recovery.  Be careful when you bend or lift an object. Use good form when lifting: ? Bend at your knees. ? Keep the object close to your body. ? Do not twist.  Sleep on a firm mattress. Lie on your side, and bend your knees. If you lie on your back, put a pillow under your knees.  Take medicines only as told by your doctor.  Put ice on the injured area. ? Put ice in a plastic bag. ? Place a towel between your skin and the bag. ? Leave the ice on for 20 minutes, 2-3 times a day for the first 2-3 days. After that, you can switch between ice and heat packs.  Avoid feeling anxious or stressed. Find good ways to deal with stress, such as exercise.  Maintain a healthy weight. Extra weight puts stress on your back.  Contact a doctor if:  You have pain that does not go away with rest or medicine.  You have worsening pain that goes down into your legs or buttocks.  You have pain that does not get better in one week.  You have pain at night.  You lose weight.  You have a fever or chills. Get help right away if:  You cannot control when you poop (bowel movement) or pee (urinate).  Your arms or legs feel weak.  Your arms or legs lose feeling (numbness).  You feel sick to your stomach (nauseous) or throw up (vomit).  You have belly (abdominal) pain.  You feel like you may pass out (faint). This information is not intended to replace advice given to you by your health care provider. Make sure you discuss any questions you have with your health care provider. Document Released: 08/22/2007 Document Revised:  08/11/2015 Document Reviewed: 07/07/2013 Elsevier Interactive Patient Education  2018 [employer] Inc.  

## 2017-06-05 NOTE — Progress Notes (Signed)
Kristen Berger 47 y.o.   Chief Complaint  Patient presents with  . Back Pain    upper area x 6 months on/off - started last night    HISTORY OF PRESENT ILLNESS: This is a 47 y.o. female complaining of chronic upper mid back pain for at least 6 months. Also complaining of a burning feeling in the throat worse with cold liquids/food.  This started several weeks ago.  Also noticed a gurgling sound when she breathes and is position dependent.  No other significant symptoms.  Eating and drinking well.  Not losing weight.  Denies smoking.  Not EtOH abuser.  Denies flulike symptoms.  Denies fever or chills.  HPI   Prior to Admission medications   Medication Sig Start Date End Date Taking? Authorizing Provider  amoxicillin (AMOXIL) 500 MG capsule Take 1 capsule (500 mg total) by mouth 3 (three) times daily. Patient not taking: Reported on 06/05/2017 11/25/12   Kristen Haw, MD  methyldopa (ALDOMET) 250 MG tablet Take 250 mg by mouth 3 (three) times daily.    [provider]    No Known Allergies  There are no active problems to display for this patient.   Past Medical History:  Diagnosis Date  . Hypertension     Past Surgical History:  Procedure Laterality Date  . ABDOMINAL SURGERY    . right ovary      Social History   Socioeconomic History  . Marital status: Married    Spouse name: Not on file  . Number of children: Not on file  . Years of education: Not on file  . Highest education level: Not on file  Social Needs  . Financial resource strain: Not on file  . Food insecurity - worry: Not on file  . Food insecurity - inability: Not on file  . Transportation needs - medical: Not on file  . Transportation needs - non-medical: Not on file  Occupational History  . Not on file  Tobacco Use  . Smoking status: Never Smoker  . Smokeless tobacco: Never Used  Substance and Sexual Activity  . Alcohol use: No  . Drug use: No  . Sexual activity: Not on file    Other Topics Concern  . Not on file  Social History Narrative   ** Merged History Encounter **        No family history on file.   Review of Systems  Constitutional: Negative.  Negative for chills, fever and weight loss.  HENT: Positive for sore throat. Negative for congestion and nosebleeds.   Eyes: Negative.  Negative for blurred vision and double vision.  Respiratory: Negative.  Negative for cough and shortness of breath.   Cardiovascular: Negative.  Negative for chest pain and palpitations.  Gastrointestinal: Negative.  Negative for abdominal pain, blood in stool, diarrhea, nausea and vomiting.  Genitourinary: Negative.  Negative for dysuria and hematuria.  Musculoskeletal: Negative for back pain, myalgias and neck pain.  Skin: Negative.  Negative for rash.  Neurological: Negative.  Negative for dizziness and headaches.  Endo/Heme/Allergies: Negative.  Negative for environmental allergies. Does not bruise/bleed easily.  All other systems reviewed and are negative.   Vitals:   06/05/17 1140  BP: (!) 142/89  Pulse: 89  Resp: 16  Temp: 98.3 F (36.8 C)  SpO2: 97%    Physical Exam  Constitutional: She appears well-developed and well-nourished.  HENT:  Head: Normocephalic and atraumatic.  Right Ear: External ear normal.  Left Ear: External ear normal.  Nose:  Nose normal.  Mouth/Throat: Oropharynx is clear and moist.  Eyes: Conjunctivae and EOM are normal. Pupils are equal, round, and reactive to light.  Neck: Normal range of motion. Neck supple. No thyromegaly present.  Cardiovascular: Normal rate, regular rhythm and normal heart sounds.  Pulmonary/Chest: Effort normal and breath sounds normal.  Abdominal: Soft. Bowel sounds are normal. She exhibits no distension. There is no tenderness.  Musculoskeletal: Normal range of motion.       Cervical back: Normal.       Thoracic back: She exhibits normal range of motion and no bony tenderness.       Lumbar back: Normal.        Back:  Lymphadenopathy:    She has no cervical adenopathy.  Vitals reviewed.    ASSESSMENT & PLAN: [name] was seen today for back pain.  Diagnoses and all orders for this visit:  Upper back pain -     diclofenac (VOLTAREN) 75 MG EC tablet; Take 1 tablet (75 mg total) by mouth 2 (two) times daily for 5 days.  Musculoskeletal pain -     diclofenac (VOLTAREN) 75 MG EC tablet; Take 1 tablet (75 mg total) by mouth 2 (two) times daily for 5 days.  Throat pain -     Ambulatory referral to ENT  Laryngopharyngeal reflux (LPR) -     Ambulatory referral to ENT -     omeprazole (PRILOSEC) 20 MG capsule; Take 1 capsule (20 mg total) by mouth daily.  Esophageal spasm -     omeprazole (PRILOSEC) 20 MG capsule; Take 1 capsule (20 mg total) by mouth daily.    Patient Instructions       IF you received an x-ray today, you will receive an invoice from [facility]. Please contact [facility] at [phone] with questions or concerns regarding your invoice.   IF you received labwork today, you will receive an invoice from [facility]. Please contact [facility] at [phone] with questions or concerns regarding your invoice.   Our billing staff will not be able to assist you with questions regarding bills from these companies.  You will be contacted with the lab results as soon as they are available. The fastest way to get your results is to activate your My Chart account. Instructions are located on the last page of this paperwork. If you have not heard from [place] regarding the results in 2 weeks, please contact this office.     Dolor de espalda en adultos (Back Pain, Adult) El dolor de espalda es muy frecuente. A menudo mejora con el tiempo. La causa del dolor de espalda generalmente no es peligrosa. La [employer] de las personas puede aprender a Runner, broadcasting/film/video de espalda por s mismas. CUIDADOS EN EL HOGAR Controle su dolor de espalda a fin de Public house manager cambio.  Las siguientes indicaciones ayudarn a Psychologist, clinical que pueda sentir:  Materials engineer. Comience con caminatas cortas sobre superficies planas si es posible. Trate de caminar un poco ms cada da.  Haga ejercicios con regularidad tal como le indic el mdico. El ejercicio ayuda a que su espalda se cure ms rpidamente. Tambin ayuda a prevenir futuras lesiones al [employer] fuertes y flexibles.  No se siente, conduzca ni permanezca de pie durante ms de 30 minutos.  No permanezca en la cama. Si hace reposo ms de 1 a 2 das, puede demorar su recuperacin.  Sea cuidadoso al inclinarse o levantar un objeto. Use una tcnica apropiada para levantar peso: ?  Flexione las rodillas. ? Mantenga el objeto cerca del cuerpo. ? No gire.  Duerma sobre un [employer]. Recustese sobre un costado y flexione las rodillas. Si se recuesta [employer], coloque una almohada debajo de las rodillas.  Tome los medicamentos solamente como se lo haya indicado el mdico.  Aplique hielo sobre la zona lesionada. ? Ponga el hielo en una bolsa plstica. ? Coloque una [employer] piel y la bolsa de hielo. ? Deje el hielo durante , 2 a 3veces por da, durante los primeros 2 o 3das. Despus de eso, puede alternar entre compresas de hielo y Airline pilot.  Evite sentir ansiedad o estrs. Encuentre maneras efectivas de lidiar con el estrs, Surveyor, mining ejercicio.  Mantenga un peso saludable. El peso excesivo ejerce tensin sobre la espalda. SOLICITE AYUDA SI:  Siente dolor que no se alivia con reposo o medicamentos.  Siente cada vez ms dolor que se extiende a las piernas o los glteos.  El dolor no mejora en una semana.  Siente dolor por la noche.  Pierde peso.  Siente escalofros o fiebre. SOLICITE AYUDA DE INMEDIATO SI:  No puede controlar su materia fecal (heces) o el pis (orina).  Siente debilidad en las piernas o los brazos.  Siente prdida de la sensibilidad  (adormecimiento) en las piernas o los brazos.  Tiene malestar estomacal (nuseas) o vomita.  Siente dolor de estmago (abdominal).  Siente que se desvanece (se desmaya). Esta informacin no tiene Theme park manager el consejo del mdico. Asegrese de hacerle al mdico cualquier pregunta que tenga. Document Released: 09/18/2010 Document Revised: 03/26/2014 Document Reviewed: 07/07/2013 Elsevier Interactive Patient Education  2018 Elsevier Inc.  Back Pain, Adult Back pain is very common. The pain often gets better over time. The cause of back pain is usually not dangerous. Most people can learn to manage their back pain on their own. Follow these instructions at home: Watch your back pain for any changes. The following actions may help to lessen any pain you are feeling:  Stay active. Start with short walks on flat ground if you can. Try to walk farther each day.  Exercise regularly as told by your doctor. Exercise helps your back heal faster. It also helps avoid future injury by keeping your muscles strong and flexible.  Do not sit, drive, or stand in one place for more than 30 minutes.  Do not stay in bed. Resting more than 1-2 days can slow down your recovery.  Be careful when you bend or lift an object. Use good form when lifting: ? Bend at your knees. ? Keep the object close to your body. ? Do not twist.  Sleep on a firm mattress. Lie on your side, and bend your knees. If you lie on your back, put a pillow under your knees.  Take medicines only as told by your doctor.  Put ice on the injured area. ? Put ice in a plastic bag. ? Place a towel between your skin and the bag. ? Leave the ice on for 20 minutes, 2-3 times a day for the first 2-3 days. After that, you can switch between ice and heat packs.  Avoid feeling anxious or stressed. Find good ways to deal with stress, such as exercise.  Maintain a healthy weight. Extra weight puts stress on your back.  Contact a doctor  if:  You have pain that does not go away with rest or medicine.  You have worsening pain that goes down into your legs or  buttocks.  You have pain that does not get better in one week.  You have pain at night.  You lose weight.  You have a fever or chills. Get help right away if:  You cannot control when you poop (bowel movement) or pee (urinate).  Your arms or legs feel weak.  Your arms or legs lose feeling (numbness).  You feel sick to your stomach (nauseous) or throw up (vomit).  You have belly (abdominal) pain.  You feel like you may pass out (faint). This information is not intended to replace advice given to you by your health care provider. Make sure you discuss any questions you have with your health care provider. Document Released: 08/22/2007 Document Revised: 08/11/2015 Document Reviewed: 07/07/2013 Elsevier Interactive Patient Education  2018 Elsevier Inc.      Edwina BarthMiguel Aliz Meritt, MD [facility]
